# Patient Record
Sex: Female | Born: 1985 | Race: White | Hispanic: No | Marital: Married | Smoking: Never smoker
Health system: Southern US, Community
[De-identification: ages and names within clinical notes are randomized; demographics above are authoritative.]

## PROBLEM LIST (undated history)

## (undated) DIAGNOSIS — F419 Anxiety disorder, unspecified: Secondary | ICD-10-CM

## (undated) DIAGNOSIS — G43909 Migraine, unspecified, not intractable, without status migrainosus: Secondary | ICD-10-CM

---

## 2007-10-28 ENCOUNTER — Emergency Department (HOSPITAL_COMMUNITY): Admission: EM | Admit: 2007-10-28 | Discharge: 2007-10-29 | Payer: Self-pay | Admitting: Emergency Medicine

## 2008-09-05 ENCOUNTER — Emergency Department (HOSPITAL_COMMUNITY): Admission: EM | Admit: 2008-09-05 | Discharge: 2008-09-05 | Payer: Self-pay | Admitting: Emergency Medicine

## 2009-05-09 ENCOUNTER — Emergency Department (HOSPITAL_COMMUNITY): Admission: EM | Admit: 2009-05-09 | Discharge: 2009-05-09 | Payer: Self-pay | Admitting: Emergency Medicine

## 2009-12-18 ENCOUNTER — Emergency Department (HOSPITAL_COMMUNITY): Admission: EM | Admit: 2009-12-18 | Discharge: 2009-12-19 | Payer: Self-pay | Admitting: Emergency Medicine

## 2011-06-17 LAB — COMPREHENSIVE METABOLIC PANEL
AST: 25 U/L (ref 0–37)
Albumin: 3.9 g/dL (ref 3.5–5.2)
Alkaline Phosphatase: 65 U/L (ref 39–117)
BUN: 11 mg/dL (ref 6–23)
Chloride: 107 mEq/L (ref 96–112)
GFR calc Af Amer: >60 mL/min (ref >60)
Potassium: 3.7 mEq/L (ref 3.5–5.1)
Total Bilirubin: 0.6 mg/dL (ref 0.3–1.2)
Total Protein: 7.4 g/dL (ref 6.0–8.3)

## 2011-06-17 LAB — DIFFERENTIAL
Basophils Absolute: 0.1 10*3/uL (ref 0.0–0.1)
Basophils Relative: 1 % (ref 0–1)
Eosinophils Relative: 1 % (ref 0–5)
Lymphocytes Relative: 18 % (ref 12–46)
Monocytes Absolute: 0.5 10*3/uL (ref 0.1–1.0)
Monocytes Relative: 6 % (ref 3–12)
Neutro Abs: 6.9 10*3/uL (ref 1.7–7.7)

## 2011-06-17 LAB — CBC
HCT: 45.4 % (ref 36.0–46.0)
Platelets: 332 10*3/uL (ref 150–400)
RDW: 12.4 % (ref 11.5–15.5)
WBC: 9.2 10*3/uL (ref 4.0–10.5)

## 2011-06-17 LAB — URINALYSIS, ROUTINE W REFLEX MICROSCOPIC
Glucose, UA: NEGATIVE mg/dL
Ketones, ur: NEGATIVE mg/dL
Nitrite: NEGATIVE
Protein, ur: NEGATIVE mg/dL
Urobilinogen, UA: 0.2 mg/dL (ref 0.0–1.0)

## 2011-06-17 LAB — PREGNANCY, URINE: Preg Test, Ur: NEGATIVE

## 2014-09-12 NOTE — L&D Delivery Note (Signed)
Patient was C/C/+2 and pushed for 5 minutes with epidural.   NSVD  female infant, Apgars 8/9, weight pending.   The patient had 2nd laceration repaired with 2-0 vicryl rapide. Fundus was firm. EBL was expected amount. Placenta was delivered intact. Vagina was clear.  Baby was vigorous and doing skin to skin with mother.  Melissa Potts, Jacobs Golab

## 2014-12-19 LAB — OB RESULTS CONSOLE RPR: RPR: NONREACTIVE

## 2014-12-19 LAB — OB RESULTS CONSOLE ANTIBODY SCREEN: ANTIBODY SCREEN: NEGATIVE

## 2014-12-19 LAB — OB RESULTS CONSOLE GC/CHLAMYDIA
CHLAMYDIA, DNA PROBE: NEGATIVE
Gonorrhea: NEGATIVE

## 2014-12-19 LAB — OB RESULTS CONSOLE ABO/RH: RH TYPE: POSITIVE

## 2014-12-19 LAB — OB RESULTS CONSOLE HIV ANTIBODY (ROUTINE TESTING): HIV: NONREACTIVE

## 2014-12-19 LAB — OB RESULTS CONSOLE RUBELLA ANTIBODY, IGM: RUBELLA: IMMUNE

## 2014-12-19 LAB — OB RESULTS CONSOLE HEPATITIS B SURFACE ANTIGEN: HEP B S AG: NEGATIVE

## 2015-06-18 LAB — OB RESULTS CONSOLE GBS: GBS: NEGATIVE

## 2015-07-15 ENCOUNTER — Inpatient Hospital Stay (HOSPITAL_COMMUNITY)
Admission: AD | Admit: 2015-07-15 | Discharge: 2015-07-15 | Disposition: A | Discharge status: Home / Self Care | Payer: BLUE CROSS/BLUE SHIELD | Source: Ambulatory Visit | Attending: Obstetrics and Gynecology | Admitting: Obstetrics and Gynecology

## 2015-07-15 ENCOUNTER — Encounter (HOSPITAL_COMMUNITY): Payer: Self-pay | Admitting: *Deleted

## 2015-07-15 ENCOUNTER — Inpatient Hospital Stay (HOSPITAL_COMMUNITY)
Admission: AD | Admit: 2015-07-15 | Discharge: 2015-07-17 | DRG: 775 | Disposition: A | Discharge status: Home / Self Care | Payer: BLUE CROSS/BLUE SHIELD | Source: Ambulatory Visit | Attending: Obstetrics and Gynecology | Admitting: Obstetrics and Gynecology

## 2015-07-15 ENCOUNTER — Inpatient Hospital Stay (HOSPITAL_COMMUNITY): Payer: BLUE CROSS/BLUE SHIELD | Admitting: Anesthesiology

## 2015-07-15 DIAGNOSIS — Z3A39 39 weeks gestation of pregnancy: Secondary | ICD-10-CM

## 2015-07-15 DIAGNOSIS — IMO0001 Reserved for inherently not codable concepts without codable children: Secondary | ICD-10-CM

## 2015-07-15 DIAGNOSIS — Z6830 Body mass index (BMI) 30.0-30.9, adult: Secondary | ICD-10-CM

## 2015-07-15 DIAGNOSIS — O99214 Obesity complicating childbirth: Secondary | ICD-10-CM | POA: Diagnosis present

## 2015-07-15 DIAGNOSIS — E669 Obesity, unspecified: Secondary | ICD-10-CM | POA: Diagnosis present

## 2015-07-15 HISTORY — DX: Anxiety disorder, unspecified: F41.9

## 2015-07-15 LAB — TYPE AND SCREEN
ABO/RH(D): A POS
ANTIBODY SCREEN: NEGATIVE

## 2015-07-15 LAB — CBC
HCT: 35.3 % — ABNORMAL LOW (ref 36.0–46.0)
Hemoglobin: 11.5 g/dL — ABNORMAL LOW (ref 12.0–15.0)
MCH: 26.1 pg (ref 26.0–34.0)
MCHC: 32.6 g/dL (ref 30.0–36.0)
MCV: 80.2 fL (ref 78.0–100.0)
PLATELETS: 239 10*3/uL (ref 150–400)
RBC: 4.4 MIL/uL (ref 3.87–5.11)
RDW: 13.9 % (ref 11.5–15.5)
WBC: 18.7 10*3/uL — AB (ref 4.0–10.5)

## 2015-07-15 LAB — ABO/RH: ABO/RH(D): A POS

## 2015-07-15 LAB — POCT FERN TEST: POCT FERN TEST: NEGATIVE

## 2015-07-15 MED ORDER — PHENYLEPHRINE 40 MCG/ML (10ML) SYRINGE FOR IV PUSH (FOR BLOOD PRESSURE SUPPORT)
PREFILLED_SYRINGE | INTRAVENOUS | Status: AC
  Filled 2015-07-15: qty 10

## 2015-07-15 MED ORDER — LACTATED RINGERS IV SOLN: 500.0000 mL | INTRAVENOUS | Status: DC | PRN

## 2015-07-15 MED ORDER — ACETAMINOPHEN 325 MG PO TABS: 650.0000 mg | ORAL_TABLET | ORAL | Status: DC | PRN

## 2015-07-15 MED ORDER — OXYTOCIN BOLUS FROM INFUSION
500.0000 mL | INTRAVENOUS | Status: DC
  Administered 2015-07-16: 500 mL via INTRAVENOUS

## 2015-07-15 MED ORDER — OXYTOCIN 40 UNITS IN LACTATED RINGERS INFUSION - SIMPLE MED
62.5000 mL/h | INTRAVENOUS | Status: DC
Start: 2015-07-15 — End: 2015-07-16
  Filled 2015-07-15: qty 1000

## 2015-07-15 MED ORDER — OXYCODONE-ACETAMINOPHEN 5-325 MG PO TABS: 1.0000 | ORAL_TABLET | ORAL | Status: DC | PRN

## 2015-07-15 MED ORDER — ONDANSETRON HCL 4 MG/2ML IJ SOLN: 4.0000 mg | Freq: Four times a day (QID) | INTRAMUSCULAR | Status: DC | PRN

## 2015-07-15 MED ORDER — LIDOCAINE HCL (PF) 1 % IJ SOLN
30.0000 mL | INTRAMUSCULAR | Status: DC | PRN
  Administered 2015-07-16: 30 mL via SUBCUTANEOUS
  Filled 2015-07-15: qty 30

## 2015-07-15 MED ORDER — EPHEDRINE 5 MG/ML INJ: 10.0000 mg | INTRAVENOUS | Status: DC | PRN

## 2015-07-15 MED ORDER — LACTATED RINGERS IV SOLN
INTRAVENOUS | Status: DC
  Administered 2015-07-15: 19:00:00 via INTRAVENOUS

## 2015-07-15 MED ORDER — FENTANYL 2.5 MCG/ML BUPIVACAINE 1/10 % EPIDURAL INFUSION (WH - ANES)
INTRAMUSCULAR | Status: AC
  Filled 2015-07-15: qty 125

## 2015-07-15 MED ORDER — DIPHENHYDRAMINE HCL 50 MG/ML IJ SOLN: 12.5000 mg | INTRAMUSCULAR | Status: DC | PRN

## 2015-07-15 MED ORDER — FLEET ENEMA 7-19 GM/118ML RE ENEM: 1.0000 | ENEMA | RECTAL | Status: DC | PRN

## 2015-07-15 MED ORDER — FENTANYL 2.5 MCG/ML BUPIVACAINE 1/10 % EPIDURAL INFUSION (WH - ANES)
14.0000 mL/h | INTRAMUSCULAR | Status: DC | PRN
  Administered 2015-07-15: 14 mL/h via EPIDURAL

## 2015-07-15 MED ORDER — LIDOCAINE HCL (PF) 1 % IJ SOLN
INTRAMUSCULAR | Status: DC | PRN
  Administered 2015-07-15: 5 mL via EPIDURAL
  Administered 2015-07-15: 2 mL via EPIDURAL
  Administered 2015-07-15: 3 mL via EPIDURAL

## 2015-07-15 MED ORDER — CITRIC ACID-SODIUM CITRATE 334-500 MG/5ML PO SOLN: 30.0000 mL | ORAL | Status: DC | PRN

## 2015-07-15 MED ORDER — OXYCODONE-ACETAMINOPHEN 5-325 MG PO TABS: 2.0000 | ORAL_TABLET | ORAL | Status: DC | PRN

## 2015-07-15 MED ORDER — PHENYLEPHRINE 40 MCG/ML (10ML) SYRINGE FOR IV PUSH (FOR BLOOD PRESSURE SUPPORT): 80.0000 ug | PREFILLED_SYRINGE | INTRAVENOUS | Status: DC | PRN

## 2015-07-15 NOTE — MAU Provider Note (Signed)
Ms. Ellwood HandlerLeslie C Gaeta is a 29 y.o. G4P0011 at 6110w1d  who presents to MAU today complaining of LOF since earlier this morning. The patient states that she has had contractions since last night, but none now. She noted a small amount of blood tinge in the discharge earlier. She denies complications with the pregnancy. She reports good fetal movement.    BP 115/74 mmHg  Pulse 95  Temp(Src) 97.9 F (36.6 C) (Oral)  Resp 20  Ht 5\' 9"  (1.753 m)  Wt 220 lb 6 oz (99.961 kg)  BMI 32.53 kg/m2 GENERAL: Well-developed, well-nourished female in no acute distress.  HEAD: Normocephalic, atraumatic.  CHEST: Normal effort of breathing, regular heart rate ABDOMEN: Soft, nontender, nondistended.  PELVIC: Normal external female genitalia. Vagina is pink and rugated.  Small amount of blood tinged mucus discharge.  Negative pooling. Gravid uterus.   EXTREMITIES: No cyanosis, clubbing, or edema  Fern - negative  Fetal Monitoring:  Baseline: 125 bpm, moderate variability, + accelerations, no decelerations Contractions: few, irregular with moderate UI  A: Membranes intact  P: Report given to RN to contact MD on call for further instructions  Marny LowensteinJulie N Wenzel, PA-C 07/15/2015 6:35 AM

## 2015-07-15 NOTE — Discharge Instructions (Signed)
Braxton Hicks Contractions °Contractions of the uterus can occur throughout pregnancy. Contractions are not always a sign that you are in labor.  °WHAT ARE BRAXTON HICKS CONTRACTIONS?  °Contractions that occur before labor are called Braxton Hicks contractions, or false labor. Toward the end of pregnancy (32-34 weeks), these contractions can develop more often and may become more forceful. This is not true labor because these contractions do not result in opening (dilatation) and thinning of the cervix. They are sometimes difficult to tell apart from true labor because these contractions can be forceful and people have different pain tolerances. You should not feel embarrassed if you go to the hospital with false labor. Sometimes, the only way to tell if you are in true labor is for your health care provider to look for changes in the cervix. °If there are no prenatal problems or other health problems associated with the pregnancy, it is completely safe to be sent home with false labor and await the onset of true labor. °HOW CAN YOU TELL THE DIFFERENCE BETWEEN TRUE AND FALSE LABOR? °False Labor °· The contractions of false labor are usually shorter and not as hard as those of true labor.   °· The contractions are usually irregular.   °· The contractions are often felt in the front of the lower abdomen and in the groin.   °· The contractions may go away when you walk around or change positions while lying down.   °· The contractions get weaker and are shorter lasting as time goes on.   °· The contractions do not usually become progressively stronger, regular, and closer together as with true labor.   °True Labor °· Contractions in true labor last 30-70 seconds, become very regular, usually become more intense, and increase in frequency.   °· The contractions do not go away with walking.   °· The discomfort is usually felt in the top of the uterus and spreads to the lower abdomen and low back.   °· True labor can be  determined by your health care provider with an exam. This will show that the cervix is dilating and getting thinner.   °WHAT TO REMEMBER °· Keep up with your usual exercises and follow other instructions given by your health care provider.   °· Take medicines as directed by your health care provider.   °· Keep your regular prenatal appointments.   °· Eat and drink lightly if you think you are going into labor.   °· If Braxton Hicks contractions are making you uncomfortable:   °¨ Change your position from lying down or resting to walking, or from walking to resting.   °¨ Sit and rest in a tub of warm water.   °¨ Drink 2-3 glasses of water. Dehydration may cause these contractions.   °¨ Do slow and deep breathing several times an hour.   °WHEN SHOULD I SEEK IMMEDIATE MEDICAL CARE? °Seek immediate medical care if: °· Your contractions become stronger, more regular, and closer together.   °· You have fluid leaking or gushing from your vagina.   °· You have a fever.   °· You pass blood-tinged mucus.   °· You have vaginal bleeding.   °· You have continuous abdominal pain.   °· You have low back pain that you never had before.   °· You feel your baby's head pushing down and causing pelvic pressure.   °· Your baby is not moving as much as it used to.   °  °This information is not intended to replace advice given to you by your health care provider. Make sure you discuss any questions you have with your health care   provider. °  °Document Released: 08/29/2005 Document Revised: 09/03/2013 Document Reviewed: 06/10/2013 °Elsevier Interactive Patient Education ©2016 [employer] ° ° ° °Third Trimester of Pregnancy °The third trimester is from week 29 through week 42, months 7 through 9. The third trimester is a time when the fetus is growing rapidly. At the end of the ninth month, the fetus is about 20 inches in length and weighs 6-10 pounds.  °BODY CHANGES °Your body goes through many changes during pregnancy. The changes vary  from woman to woman.  °· Your weight will continue to increase. You can expect to gain 25-35 pounds (11-16 kg) by the end of the pregnancy. °· You may begin to get stretch marks on your hips, abdomen, and breasts. °· You may urinate more often because the fetus is moving lower into your pelvis and pressing on your bladder. °· You may develop or continue to have heartburn as a result of your pregnancy. °· You may develop constipation because certain hormones are causing the muscles that push waste through your intestines to slow down. °· You may develop hemorrhoids or swollen, bulging veins (varicose veins). °· You may have pelvic pain because of the weight gain and pregnancy hormones relaxing your joints between the bones in your pelvis. Backaches may result from overexertion of the muscles supporting your posture. °· You may have changes in your hair. These can include thickening of your hair, rapid growth, and changes in texture. Some women also have hair loss during or after pregnancy, or hair that feels dry or thin. Your hair will most likely return to normal after your baby is born. °· Your breasts will continue to grow and be tender. A yellow discharge may leak from your breasts called colostrum. °· Your belly button may stick out. °· You may feel short of breath because of your expanding uterus. °· You may notice the fetus "dropping," or moving lower in your abdomen. °· You may have a bloody mucus discharge. This usually occurs a few days to a week before labor begins. °· Your cervix becomes thin and soft (effaced) near your due date. °WHAT TO EXPECT AT YOUR PRENATAL EXAMS  °You will have prenatal exams every 2 weeks until week 36. Then, you will have weekly prenatal exams. During a routine prenatal visit: °· You will be weighed to make sure you and the fetus are growing normally. °· Your blood pressure is taken. °· Your abdomen will be measured to track your baby's growth. °· The fetal heartbeat will be  listened to. °· Any test results from the previous visit will be discussed. °· You may have a cervical check near your due date to see if you have effaced. °At around 36 weeks, your caregiver will check your cervix. At the same time, your caregiver will also perform a test on the secretions of the vaginal tissue. This test is to determine if a type of bacteria, Group B streptococcus, is present. Your caregiver will explain this further. °Your caregiver may ask you: °· What your birth plan is. °· How you are feeling. °· If you are feeling the baby move. °· If you have had any abnormal symptoms, such as leaking fluid, bleeding, severe headaches, or abdominal cramping. °· If you are using any tobacco products, including cigarettes, chewing tobacco, and electronic cigarettes. °· If you have any questions. °Other tests or screenings that may be performed during your third trimester include: °· Blood tests that check for low iron levels (anemia). °· Fetal   testing to check the health, activity level, and growth of the fetus. Testing is done if you have certain medical conditions or if there are problems during the pregnancy. °· HIV (human immunodeficiency virus) testing. If you are at high risk, you may be screened for HIV during your third trimester of pregnancy. °FALSE LABOR °You may feel small, irregular contractions that eventually go away. These are called Braxton Hicks contractions, or false labor. Contractions may last for hours, days, or even weeks before true labor sets in. If contractions come at regular intervals, intensify, or become painful, it is best to be seen by your caregiver.  °SIGNS OF LABOR  °· Menstrual-like cramps. °· Contractions that are 5 minutes apart or less. °· Contractions that start on the top of the uterus and spread down to the lower abdomen and back. °· A sense of increased pelvic pressure or back pain. °· A watery or bloody mucus discharge that comes from the vagina. °If you have any of  these signs before the 37th week of pregnancy, call your caregiver right away. You need to go to the hospital to get checked immediately. °HOME CARE INSTRUCTIONS  °· Avoid all smoking, herbs, alcohol, and unprescribed drugs. These chemicals affect the formation and growth of the baby. °· Do not use any tobacco products, including cigarettes, chewing tobacco, and electronic cigarettes. If you need help quitting, ask your health care provider. You may receive counseling support and other resources to help you quit. °· Follow your caregiver's instructions regarding medicine use. There are medicines that are either safe or unsafe to take during pregnancy. °· Exercise only as directed by your caregiver. Experiencing uterine cramps is a good sign to stop exercising. °· Continue to eat regular, healthy meals. °· Wear a good support bra for breast tenderness. °· Do not use hot tubs, steam rooms, or saunas. °· Wear your seat belt at all times when driving. °· Avoid raw meat, uncooked cheese, cat litter boxes, and soil used by cats. These carry germs that can cause birth defects in the baby. °· Take your prenatal vitamins. °· Take 1500-2000 mg of calcium daily starting at the 20th week of pregnancy until you deliver your baby. °· Try taking a stool softener (if your caregiver approves) if you develop constipation. Eat more high-fiber foods, such as fresh vegetables or fruit and whole grains. Drink plenty of fluids to keep your urine clear or pale yellow. °· Take warm sitz baths to soothe any pain or discomfort caused by hemorrhoids. Use hemorrhoid cream if your caregiver approves. °· If you develop varicose veins, wear support hose. Elevate your feet for 15 minutes, 3-4 times a day. Limit salt in your diet. °· Avoid heavy lifting, wear low heal shoes, and practice good posture. °· Rest a lot with your legs elevated if you have leg cramps or low back pain. °· Visit your dentist if you have not gone during your pregnancy. Use a  soft toothbrush to brush your teeth and be gentle when you floss. °· A sexual relationship may be continued unless your caregiver directs you otherwise. °· Do not travel far distances unless it is absolutely necessary and only with the approval of your caregiver. °· Take prenatal classes to understand, practice, and ask questions about the labor and delivery. °· Make a trial run to the hospital. °· Pack your hospital bag. °· Prepare the baby's nursery. °· Continue to go to all your prenatal visits as directed by your caregiver. °SEEK MEDICAL CARE   IF: °· You are unsure if you are in labor or if your water has broken. °· You have dizziness. °· You have mild pelvic cramps, pelvic pressure, or nagging pain in your abdominal area. °· You have persistent nausea, vomiting, or diarrhea. °· You have a bad smelling vaginal discharge. °· You have pain with urination. °SEEK IMMEDIATE MEDICAL CARE IF:  °· You have a fever. °· You are leaking fluid from your vagina. °· You have spotting or bleeding from your vagina. °· You have severe abdominal cramping or pain. °· You have rapid weight loss or gain. °· You have shortness of breath with chest pain. °· You notice sudden or extreme swelling of your face, hands, ankles, feet, or legs. °· You have not felt your baby move in over an hour. °· You have severe headaches that do not go away with medicine. °· You have vision changes. °  °This information is not intended to replace advice given to you by your health care provider. Make sure you discuss any questions you have with your health care provider. °  °Document Released: 08/23/2001 Document Revised: 09/19/2014 Document Reviewed: 10/30/2012 °Elsevier Interactive Patient Education ©2016 Elsevier Inc. ° °

## 2015-07-15 NOTE — Anesthesia Preprocedure Evaluation (Addendum)
Anesthesia Evaluation  Patient identified by MRN, date of birth, ID band Patient awake    Reviewed: Allergy & Precautions, NPO status , Patient's Chart, lab work & pertinent test results  History of Anesthesia Complications Negative for: history of anesthetic complications  Airway Mallampati: III  TM Distance: >3 FB Neck ROM: Full    Dental  (+) Teeth Intact, Dental Advisory Given   Pulmonary neg pulmonary ROS,    Pulmonary exam normal breath sounds clear to auscultation       Cardiovascular Exercise Tolerance: Good negative cardio ROS Normal cardiovascular exam Rhythm:Regular Rate:Normal     Neuro/Psych PSYCHIATRIC DISORDERS Anxiety negative neurological ROS     GI/Hepatic negative GI ROS, Neg liver ROS,   Endo/Other  Obesity   Renal/GU negative Renal ROS     Musculoskeletal negative musculoskeletal ROS (+)   Abdominal   Peds  Hematology  (+) Blood dyscrasia, anemia ,   Anesthesia Other Findings Day of surgery medications reviewed with the patient.  Reproductive/Obstetrics (+) Pregnancy                            Anesthesia Physical Anesthesia Plan  ASA: II  Anesthesia Plan: Epidural   Post-op Pain Management:    Induction:   Airway Management Planned:   Additional Equipment:   Intra-op Plan:   Post-operative Plan:   Informed Consent: I have reviewed the patients History and Physical, chart, labs and discussed the procedure including the risks, benefits and alternatives for the proposed anesthesia with the patient or authorized representative who has indicated his/her understanding and acceptance.   Dental advisory given  Plan Discussed with:   Anesthesia Plan Comments: (Patient identified. Risks/Benefits/Options discussed with patient including but not limited to bleeding, infection, nerve damage, paralysis, failed block, incomplete pain control, headache, blood  pressure changes, nausea, vomiting, reactions to medication both or allergic, itching and postpartum back pain. Confirmed with bedside nurse the patient's most recent platelet count. Confirmed with patient that they are not currently taking any anticoagulation, have any bleeding history or any family history of bleeding disorders. Patient expressed understanding and wished to proceed. All questions were answered. )        Anesthesia Quick Evaluation

## 2015-07-15 NOTE — H&P (Signed)
29 y.o. 5296w1d  G4P0011 comes in c/o labor.  Otherwise has good fetal movement and no bleeding.  Past Medical History  Diagnosis Date  . Anxiety    History reviewed. No pertinent past surgical history.  OB History  Gravida Para Term Preterm AB SAB TAB Ectopic Multiple Living  4 1 0  1 1    1     # Outcome Date GA Lbr Len/2nd Weight Sex Delivery Anes PTL Lv  4 Current           3 Para           2 Gravida      Vag-Spont     1 SAB               Social History   Social History  . Marital Status: Married    Spouse Name: N/A  . Number of Children: N/A  . Years of Education: N/A   Occupational History  . Not on file.   Social History Main Topics  . Smoking status: Never Smoker   . Smokeless tobacco: Never Used  . Alcohol Use: No  . Drug Use: No  . Sexual Activity: Yes   Other Topics Concern  . Not on file   Social History Narrative   Review of patient's allergies indicates no known allergies.    Prenatal Transfer Tool  Maternal Diabetes: No Genetic Screening: Declined Maternal Ultrasounds/Referrals: Normal Fetal Ultrasounds or other Referrals:  None Maternal Substance Abuse:  No Significant Maternal Medications:  None Significant Maternal Lab Results: Lab values include: Group B Strep negative  Other PNC: uncomplicated.    Filed Vitals:   07/15/15 1903  BP: 139/107  Pulse: 94  Temp:   Resp:      Lungs/Cor:  NAD Abdomen:  soft, gravid Ex:  no cords, erythema SVE:  4.5/90/-1 FHTs:  135, good STV, NST R Toco:  q2-3   A/P   Admit with labor  GBS neg  Epidural when desired  Once comfortable will consider AROM  Other routine care  [place], [facility] Center[facility]NEY

## 2015-07-15 NOTE — MAU Note (Signed)
PT  SAYS SHE WOKE  AT 0400-  WETNESS   - TO B-ROOM   BUT  FLUID  KEEPS  COMING   .  UC  ALL NIGHT.   VE IN OFFICE   ON Monday  2 CM.        DENIES HSV AND   MRSA.  GBS- NEG

## 2015-07-15 NOTE — MAU Note (Signed)
Pt presents to MAU with complaints of contractions. Pt was evaluated in MAU earlier today and discharged home. Denies any LOF

## 2015-07-15 NOTE — Anesthesia Procedure Notes (Signed)
Epidural Patient location during procedure: OB  Staffing Anesthesiologist: Cecile HearingURK, Nuria Phebus EDWARD Performed by: anesthesiologist   Preanesthetic Checklist Completed: patient identified, pre-op evaluation, timeout performed, IV checked, risks and benefits discussed and monitors and equipment checked  Epidural Patient position: sitting Prep: DuraPrep Patient monitoring: blood pressure and continuous pulse ox Approach: midline Location: L2-L3 Injection technique: LOR air  Needle:  Needle type: Tuohy  Needle gauge: 17 G Needle length: 9 cm Needle insertion depth: 5 cm Catheter size: 19 Gauge Catheter at skin depth: 10 cm Test dose: negative and Other (1% Lidocaine)  Additional Notes Patient identified.  Risk benefits discussed including failed block, incomplete pain control, headache, nerve damage, paralysis, blood pressure changes, nausea, vomiting, reactions to medication both toxic or allergic, and postpartum back pain.  Patient expressed understanding and wished to proceed.  All questions were answered.  Sterile technique used throughout procedure and epidural site dressed with sterile barrier dressing. No paresthesia or other complications noted. The patient did not experience any signs of intravascular injection such as tinnitus or metallic taste in mouth nor signs of intrathecal spread such as rapid motor block. Please see nursing notes for vital signs. Reason for block:procedure for pain

## 2015-07-15 NOTE — MAU Note (Signed)
Notified provider of patient G3P1. cxt every 2-3 mins was 3/80 and hour ago now she is 4.5/90/-1. Provider said to admit her and to put in routine admit orders and that patient could have an epidural.

## 2015-07-16 ENCOUNTER — Encounter (HOSPITAL_COMMUNITY): Payer: Self-pay

## 2015-07-16 ENCOUNTER — Encounter (HOSPITAL_COMMUNITY): Payer: Self-pay | Admitting: Anesthesiology

## 2015-07-16 LAB — CBC
HCT: 33.5 % — ABNORMAL LOW (ref 36.0–46.0)
HEMOGLOBIN: 10.8 g/dL — AB (ref 12.0–15.0)
MCH: 26 pg (ref 26.0–34.0)
MCHC: 32.2 g/dL (ref 30.0–36.0)
MCV: 80.7 fL (ref 78.0–100.0)
PLATELETS: 221 10*3/uL (ref 150–400)
RBC: 4.15 MIL/uL (ref 3.87–5.11)
RDW: 14.1 % (ref 11.5–15.5)
WBC: 23.4 10*3/uL — ABNORMAL HIGH (ref 4.0–10.5)

## 2015-07-16 LAB — RPR: RPR Ser Ql: NONREACTIVE

## 2015-07-16 MED ORDER — SENNOSIDES-DOCUSATE SODIUM 8.6-50 MG PO TABS
2.0000 | ORAL_TABLET | ORAL | Status: DC
  Filled 2015-07-16: qty 2

## 2015-07-16 MED ORDER — ACETAMINOPHEN 325 MG PO TABS: 650.0000 mg | ORAL_TABLET | ORAL | Status: DC | PRN

## 2015-07-16 MED ORDER — IBUPROFEN 600 MG PO TABS
600.0000 mg | ORAL_TABLET | Freq: Four times a day (QID) | ORAL | Status: DC
  Administered 2015-07-16 – 2015-07-17 (×7): 600 mg via ORAL
  Filled 2015-07-16 (×7): qty 1

## 2015-07-16 MED ORDER — WITCH HAZEL-GLYCERIN EX PADS
1.0000 "application " | MEDICATED_PAD | CUTANEOUS | Status: DC | PRN
  Administered 2015-07-16: 1 via TOPICAL

## 2015-07-16 MED ORDER — LANOLIN HYDROUS EX OINT: TOPICAL_OINTMENT | CUTANEOUS | Status: DC | PRN

## 2015-07-16 MED ORDER — TETANUS-DIPHTH-ACELL PERTUSSIS 5-2.5-18.5 LF-MCG/0.5 IM SUSP: 0.5000 mL | Freq: Once | INTRAMUSCULAR | Status: DC

## 2015-07-16 MED ORDER — ZOLPIDEM TARTRATE 5 MG PO TABS: 5.0000 mg | ORAL_TABLET | Freq: Every evening | ORAL | Status: DC | PRN

## 2015-07-16 MED ORDER — OXYCODONE-ACETAMINOPHEN 5-325 MG PO TABS: 2.0000 | ORAL_TABLET | ORAL | Status: DC | PRN

## 2015-07-16 MED ORDER — OXYCODONE-ACETAMINOPHEN 5-325 MG PO TABS: 1.0000 | ORAL_TABLET | ORAL | Status: DC | PRN

## 2015-07-16 MED ORDER — BENZOCAINE-MENTHOL 20-0.5 % EX AERO
1.0000 "application " | INHALATION_SPRAY | CUTANEOUS | Status: DC | PRN
  Administered 2015-07-16: 1 via TOPICAL
  Filled 2015-07-16: qty 56

## 2015-07-16 MED ORDER — DIBUCAINE 1 % RE OINT: 1.0000 "application " | TOPICAL_OINTMENT | RECTAL | Status: DC | PRN

## 2015-07-16 MED ORDER — SIMETHICONE 80 MG PO CHEW: 80.0000 mg | CHEWABLE_TABLET | ORAL | Status: DC | PRN

## 2015-07-16 MED ORDER — ONDANSETRON HCL 4 MG PO TABS: 4.0000 mg | ORAL_TABLET | ORAL | Status: DC | PRN

## 2015-07-16 MED ORDER — DIPHENHYDRAMINE HCL 25 MG PO CAPS: 25.0000 mg | ORAL_CAPSULE | Freq: Four times a day (QID) | ORAL | Status: DC | PRN

## 2015-07-16 MED ORDER — PRENATAL MULTIVITAMIN CH
1.0000 | ORAL_TABLET | Freq: Every day | ORAL | Status: DC
  Administered 2015-07-16 – 2015-07-17 (×2): 1 via ORAL
  Filled 2015-07-16 (×2): qty 1

## 2015-07-16 MED ORDER — ONDANSETRON HCL 4 MG/2ML IJ SOLN: 4.0000 mg | INTRAMUSCULAR | Status: DC | PRN

## 2015-07-16 NOTE — Anesthesia Postprocedure Evaluation (Signed)
  Anesthesia Post-op Note  Patient: Melissa Potts  Procedure(s) Performed: * No procedures listed *  Patient Location: PACU and Mother/Baby  Anesthesia Type:Epidural  Level of Consciousness: awake, alert  and oriented  Airway and Oxygen Therapy: Patient Spontanous Breathing  Post-op Pain: mild  Post-op Assessment: Patient's Cardiovascular Status Stable, Respiratory Function Stable, No signs of Nausea or vomiting, Adequate PO intake, Pain level controlled, No headache, No backache and Patient able to bend at knees              Post-op Vital Signs: Reviewed and stable  Last Vitals:  Filed Vitals:   07/16/15 0655  BP: 124/72  Pulse: 72  Temp: 36.8 C  Resp: 18    Complications: No apparent anesthesia complications

## 2015-07-16 NOTE — Progress Notes (Signed)
Post Partum Day 1 Subjective: no complaints, up ad lib, voiding and tolerating PO  Objective: Blood pressure 124/72, pulse 72, temperature 98.2 F (36.8 C), temperature source Oral, resp. rate 18, height 5\' 11"  (1.803 m), weight 218 lb (98.884 kg), SpO2 100 %, unknown if currently breastfeeding.  Physical Exam:  General: alert, cooperative and appears stated age Lochia: appropriate Uterine Fundus: firm   Recent Labs  07/15/15 1820 07/16/15 0620  HGB 11.5* 10.8*  HCT 35.3* 33.5*    Assessment/Plan: Plan for discharge tomorrow and Breastfeeding  Declines circ   LOS: 1 day   Tadd Holtmeyer H. 07/16/2015, 9:49 AM

## 2015-07-16 NOTE — Lactation Note (Signed)
This note was copied from the chart of Melissa Potts Anguiano. Lactation Consultation Note  Patient Name: Melissa Potts Cariker ZOXWR'UToday's Date: 07/16/2015 Reason for consult: Follow-up assessment;Breast/nipple pain  16 hours old , baby has been consistent at the breast. Per mom just finished feeding both breast .  Baby  still acting hungry so LC changed large  diaper and assisted with football position.  Reviewed basics - breast massage , hand express,  Breast compressions until swallows and intermittent. Mom has comfort gels.    Maternal Data Has patient been taught Hand Expression?: Yes  Feeding Feeding Type: Breast Fed Length of feed: 12 min (per mom )  LATCH Score/Interventions Latch: Grasps breast easily, tongue down, lips flanged, rhythmical sucking.  Audible Swallowing: Spontaneous and intermittent Intervention(s): Skin to skin;Hand expression  Type of Nipple: Everted at rest and after stimulation  Comfort (Breast/Nipple): Soft / non-tender     Hold (Positioning): Assistance needed to correctly position infant at breast and maintain latch. Intervention(s): Breastfeeding basics reviewed;Support Pillows;Position options;Skin to skin  LATCH Score: 9  Lactation Tools Discussed/Used Tools: Comfort gels   Consult Status Consult Status: Follow-up Date: 07/17/15 Follow-up type: In-patient    Kathrin Greathouseorio, Karon Cotterill Ann 07/16/2015, 4:49 PM

## 2015-07-16 NOTE — Lactation Note (Signed)
This note was copied from the chart of Melissa Irean HongLeslie Lech. Lactation Consultation Note Experienced BF mom has a 27 yr old that she BF for 8 months. States she is going to try to BF this baby for 1 yr.  Mom states this baby is BF great and latching w/o difficulty.  Educated about newborn behavior. Reviewed Baby & Me book's Breastfeeding Basics. Mom encouraged to do skin-to-skin.[facility]/LC brochure given w/resources, support groups and LC services. Patient Name: Melissa Potts [id number]'XToday's Date: 07/16/2015 Reason for consult: Initial assessment   Maternal Data Formula Feeding for Exclusion: No Has patient been taught Hand Expression?: Yes Does the patient have breastfeeding experience prior to this delivery?: Yes  Feeding Feeding Type: Breast Fed  LATCH Score/Interventions       Type of Nipple: Everted at rest and after stimulation  Comfort (Breast/Nipple): Soft / non-tender     Hold (Positioning): No assistance needed to correctly position infant at breast.     Lactation Tools Discussed/Used [facility] No   Consult Status Consult Status: PRN Date: 07/17/15 Follow-up type: In-patient    Charyl DancerCARVER, Siobhan Zaro G 07/16/2015, 4:41 AM

## 2015-07-16 NOTE — Addendum Note (Signed)
Addendum  created 07/16/15 1218 by Elbert Ewingsolleen S Andrey Hoobler, CRNA   Modules edited: Charges VN, Notes Section   Notes Section:  File: [id number]

## 2015-07-17 NOTE — Clinical Social Work Maternal (Signed)
CLINICAL SOCIAL WORK MATERNAL/CHILD NOTE  Patient Details  Name: Dannell C Glahn MRN: [id number] Date of Birth: 12/16/1985  Date:  07/17/2015  Clinical Social Worker Initiating Note:  Talbot Monarch MSW, LCSW Date/ Time Initiated:  07/17/15/0910     Child's Name:  Ellington   Legal Guardian:  Brandon and Cruzita Hildebrant  Need for Interpreter:  None   Date of Referral:  07/16/15     Reason for Referral:  History of anxiety  Referral Source:  Central Nursery   Address:  [address] , [place] [address]  Phone number:  [phone]   Household Members:  Minor Children (Sebastian: 2 years old), Spouse   Natural Supports (not living in the home):  Extended Family, Friends, Immediate Family   Professional Supports: None   Employment: Full-time   Type of Work: Teacher   Education:    N/A  Financial Resources:  Private Insurance   Other Resources:    None identified  Cultural/Religious Considerations Which May Impact Care:  None reported  Strengths:  Ability to meet basic needs , Pediatrician chosen , Home prepared for child    Risk Factors/Current Problems:  None   Cognitive State:  Able to Concentrate , Alert , Insightful , Linear Thinking , Goal Oriented    Mood/Affect:  Bright , Happy , Comfortable , Calm    CSW Assessment:  CSW received request for consult due to MOB presenting with a history of anxiety.  MOB's mother was also present in the room, and provided consent for her to remain in the room during the assessment.  MOB presented in a pleasant mood, displayed a full range in affect, and expressed eagerness and readiness for discharge.   MOB was observed to be smiling as she cared for and interacted with the infant, and did not exhibit any symptoms of anxiety during the assessment.   MOB reported feelings of happiness and excitement secondary to the infant's birth and related to her childbirth experience . She stated she feels more comfortable and  confident as this is her second child, and expressed readiness to return home.  She endorsed presence of strong support system, and she expressed comfort utilizing her support to assist her transition home.  MOB shared that she is looking forward to transitioning to caring for two sons, and discussed an awareness of the range of emotions that accompany the adjustment period.  MOB reported belief that she is well supported by her employer, and discussed looking forward to spending time at home with the infant.  MOB denied presence of acute psychosocial stressors that may negatively impact her transition home.   Per MOB, history of anxiety occurred more than 3 years ago. She stated that she was previously participating in therapy and was prescribed medications when she noted that anxiety was negatively impacting her life. She shared that she discontinued treatment prior to conceive her first child.  MOB denied any additional/ongoing need for treatment, and shared belief that therapy assisted her to learn cognitive techniques and emotional regulation skills that have helped her to manage her symptoms.  She denied presence of perinatal mood disorders after her first child was born, and denied mental health concerns during this pregnancy.  MOB presented as attentive and engaged as CSW normalized range of emotions that accompany transition postpartum, including the Baby Blues. CSW reviewed signs and symptoms of perinatal mood disorders, and MOB agreed to follow up with her medical provider if she notes onset of symptoms.   MOB   denied questions, concerns, or needs at this time. She expressed appreciation for the information, and agreed to contact CSW if needs arise during the admission.    CSW Plan/Description:   1)Patient/Family Education: Perinatal mood disorders 2)No Further Intervention Required/No Barriers to Discharge    Kelby FamVenning, Obediah Welles N, LCSW 07/17/2015, 11:00 AM

## 2015-07-17 NOTE — Discharge Summary (Signed)
Obstetric Discharge Summary Reason for Admission: onset of labor Prenatal Procedures: none Intrapartum Procedures: spontaneous vaginal delivery Postpartum Procedures: none Complications-Operative and Postpartum: 2 degree perineal laceration HEMOGLOBIN  Date Value Ref Range Status  07/16/2015 10.8* 12.0 - 15.0 g/dL Final   HCT  Date Value Ref Range Status  07/16/2015 33.5* 36.0 - 46.0 % Final    Discharge Diagnoses: Term Pregnancy-delivered  Discharge Information: Date: 07/17/2015 Activity: pelvic rest Diet: routine Medications: Ibuprofen Condition: stable Instructions: refer to practice specific booklet Discharge to: home Follow-up Information    Follow up with CALLAHAN, SIDNEY, DO In 4 weeks.   Specialty:  Obstetrics and Gynecology   Contact information:   [address] [place] [address] [phone]       Newborn Data: Live born female  Birth Weight: 7 lb 8.3 oz (3410 g) APGAR: 8, 9  Home with mother.  Melissa Potts A 07/17/2015, 7:40 AM

## 2015-07-17 NOTE — Progress Notes (Signed)
Patient is eating, ambulating, voiding.  Pain control is good.  Filed Vitals:   07/16/15 0655 07/16/15 1448 07/16/15 1852 07/17/15 0538  BP: 124/72 109/66 116/70 113/74  Pulse: 72 71 69 75  Temp: 98.2 F (36.8 C) 97.9 F (36.6 C) 97.8 F (36.6 C) 97.6 F (36.4 C)  TempSrc: Oral Oral Oral Oral  Resp: 18 20 18 18   Height:      Weight:      SpO2:        Fundus firm Perineum without swelling.  Lab Results  Component Value Date   WBC 23.4* 07/16/2015   HGB 10.8* 07/16/2015   HCT 33.5* 07/16/2015   MCV 80.7 07/16/2015   PLT 221 07/16/2015    --/--/A POS, A POS (11/02 1820)/RI  A/P Post partum day 1.  Routine care.  Expect d/c routine.    Harrell Niehoff A

## 2015-07-17 NOTE — Lactation Note (Signed)
This note was copied from the chart of Melissa Potts [place]. Lactation Consultation Note  Patient Name: Melissa Potts Mcclellan GUYQI'HToday's Date: 07/17/2015 Reason for consult: Follow-up assessment  33 hours old , for early D/C today . 7-3.2 oz, 4 % weight loss. Voids and stools adequate for age. Baby has been consistently breast feeding and Latch score range - 8-9 's, @ 33 hours Bili 5.4  Per mom nipples are still sore but not any worse than yesterday.  Per mom EBM is helping a lot , and using the comfort gels between feedings.  Baby awake and hungry and LC reviewed basics, 1st latch LC guided mom to latch with depth  And per mom more comfortable. Baby released after 7 mins , nipple normal shape, and mom independently  Re- latched baby, multiply swallows noted.  Sore nipple and engorgement prevention and tx reviewed. Per mom has a DEBP at home.  LC encouraged mom to call if soreness isn't improved at [facility]  LC encouraged naps, rest , plenty fluids, nutritious snacks and meals due to sore nipples , also hx. Of anxiety. Mom has supportive husband and mother. Mother informed of post-discharge support and given phone number to the lactation department, including services for  phone call assistance; out-patient appointments; and breastfeeding support group. List of other breastfeeding resources in  the community given in the handout. Encouraged mother to call for problems or concerns related to breastfeeding.    Maternal Data Has patient been taught Hand Expression?: Yes  Feeding Feeding Type: Breast Fed Length of feed:  (swallows noted , comfortable latch )  LATCH Score/Interventions Latch: Grasps breast easily, tongue down, lips flanged, rhythmical sucking.  Audible Swallowing: Spontaneous and intermittent  Type of Nipple: Everted at rest and after stimulation  Comfort (Breast/Nipple): Filling, red/small blisters or bruises, mild/mod discomfort  Problem noted: Filling  Hold  (Positioning): No assistance needed to correctly position infant at breast. Intervention(s): Breastfeeding basics reviewed;Support Pillows;Position options;Skin to skin  LATCH Score: 9  Lactation Tools Discussed/Used Tools: Comfort gels WIC Program: No Pump Review: Setup, frequency, and cleaning;Milk Storage   Consult Status Consult Status: Complete Date: 07/17/15 Follow-up type: In-patient    Kathrin Greathouseorio, Seirra Kos Ann 07/17/2015, 9:38 AM

## 2016-09-05 ENCOUNTER — Emergency Department (HOSPITAL_COMMUNITY)
Admission: EM | Admit: 2016-09-05 | Discharge: 2016-09-05 | Disposition: A | Discharge status: Home / Self Care | Payer: BLUE CROSS/BLUE SHIELD | Attending: Emergency Medicine | Admitting: Emergency Medicine

## 2016-09-05 ENCOUNTER — Encounter (HOSPITAL_COMMUNITY): Payer: Self-pay | Admitting: Emergency Medicine

## 2016-09-05 ENCOUNTER — Emergency Department (HOSPITAL_COMMUNITY): Payer: BLUE CROSS/BLUE SHIELD

## 2016-09-05 DIAGNOSIS — Y939 Activity, unspecified: Secondary | ICD-10-CM | POA: Insufficient documentation

## 2016-09-05 DIAGNOSIS — S060X0A Concussion without loss of consciousness, initial encounter: Secondary | ICD-10-CM

## 2016-09-05 DIAGNOSIS — W228XXA Striking against or struck by other objects, initial encounter: Secondary | ICD-10-CM | POA: Insufficient documentation

## 2016-09-05 DIAGNOSIS — Y929 Unspecified place or not applicable: Secondary | ICD-10-CM | POA: Insufficient documentation

## 2016-09-05 DIAGNOSIS — Y999 Unspecified external cause status: Secondary | ICD-10-CM | POA: Insufficient documentation

## 2016-09-05 DIAGNOSIS — S0990XA Unspecified injury of head, initial encounter: Secondary | ICD-10-CM | POA: Diagnosis present

## 2016-09-05 MED ORDER — ONDANSETRON HCL 4 MG PO TABS: 4.0000 mg | ORAL_TABLET | Freq: Four times a day (QID) | ORAL | 0 refills | Status: DC

## 2016-09-05 MED ORDER — ACETAMINOPHEN 325 MG PO TABS
650.0000 mg | ORAL_TABLET | Freq: Once | ORAL | Status: AC
  Administered 2016-09-05: 650 mg via ORAL
  Filled 2016-09-05: qty 2

## 2016-09-05 MED ORDER — ONDANSETRON 4 MG PO TBDP
4.0000 mg | ORAL_TABLET | Freq: Once | ORAL | Status: AC
  Administered 2016-09-05: 4 mg via ORAL
  Filled 2016-09-05: qty 1

## 2016-09-05 NOTE — ED Provider Notes (Signed)
[facility]-EMERGENCY DEPT Provider Note   CSN: [id number] Arrival date & time: 09/05/16  1928  By signing my name below, I, Melissa Potts, attest that this documentation has been prepared under the direction and in the presence of Melissa Peliffany Chynah Orihuela, PA-C. Electronically Signed: Doreatha MartinEva Potts, ED Scribe. 09/05/16. 8:22 PM.    History   Chief Complaint Chief Complaint  Patient presents with  . Head Injury    HPI Melissa Potts is a 30 y.o. female who presents to the Emergency Department complaining of moderate right temple pain and pressure with radiation to right ear s/p head injury that occurred 2 hours ago. Pt states she struck her right temple on the corner of a cabinet after standing up quickly. She denies LOC, but states she has since felt nauseated and lightheaded. Pt also complains she finds it difficult to gather her thoughts since striking her head. Husband states the pt has been acting a bit slower since the injury. Pt is ambulatory without difficulty. Pt states she has rested and applied ice to the area with temporary relief. She states her nausea is alleviated at rest and worsened with movement and walking. Pt denies dental injury, neck pain, additional injuries.   The history is provided by the patient and the spouse. No language interpreter was used.    Past Medical History:  Diagnosis Date  . Anxiety     Patient Active Problem List   Diagnosis Date Noted  . Active labor at term 07/15/2015    History reviewed. No pertinent surgical history.  OB History    Gravida Para Term Preterm AB Living   3 1 1   1 1    SAB TAB Ectopic Multiple Live Births   1     0 1       Home Medications    Prior to Admission medications   Medication Sig Start Date End Date Taking? Authorizing Provider  acetaminophen (TYLENOL) 500 MG tablet Take 500 mg by mouth every 6 (six) hours as needed for moderate pain or headache.    Yes Historical Provider, MD  DM-Phenylephrine-Acetaminophen (VICKS  DAYQUIL COLD & FLU PO) Take 2 capsules by mouth every 6 (six) hours as needed (congestion).   Yes Historical Provider, MD  ondansetron (ZOFRAN) 4 MG tablet Take 1 tablet (4 mg total) by mouth every 6 (six) hours. 09/05/16   Melissa Peliffany Particia Strahm, PA-C    Family History History reviewed. No pertinent family history.  Social History Social History  Substance Use Topics  . Smoking status: Never Smoker  . Smokeless tobacco: Never Used  . Alcohol use No     Allergies   Banana   Review of Systems Review of Systems  HENT: Positive for ear pain. Negative for dental problem.   Gastrointestinal: Positive for nausea.  Musculoskeletal: Negative for neck pain.  Neurological: Positive for light-headedness and headaches. Negative for syncope.  All other systems reviewed and are negative.    Physical Exam Updated Vital Signs BP (!) 114/54 (BP Location: Left Arm)   Pulse 65   Temp 97.6 F (36.4 C) (Oral)   Resp 19   Ht 5\' 11"  (1.803 m)   Wt 81.7 kg   SpO2 98%   BMI 25.12 kg/m   Physical Exam  Constitutional: She is oriented to person, place, and time. She appears well-developed and well-nourished.  HENT:  Head: Normocephalic.  Mild tenderness to the right temporal region. Small amount of ecchymosis. No hematoma. No skull depression. FROM of her jaw.  Eyes: Conjunctivae are normal.  Cardiovascular: Normal rate.   Pulmonary/Chest: Effort normal. No respiratory distress.  Abdominal: She exhibits no distension.  Musculoskeletal: Normal range of motion.  Neurological: She is alert and oriented to person, place, and time. She has normal strength. She displays no tremor. No cranial nerve deficit or sensory deficit. Coordination normal.  Skin: Skin is warm and dry.  Psychiatric: She has a normal mood and affect. Her behavior is normal.  Nursing note and vitals reviewed.    ED Treatments / Results   DIAGNOSTIC STUDIES: Oxygen Saturation is 100% on RA, normal by my interpretation.     COORDINATION OF CARE: 8:16 PM Discussed treatment plan with pt at bedside which includes CT head, Zofran, Tylenol and pt agreed to plan.    Labs (all labs ordered are listed, but only abnormal results are displayed) Labs Reviewed - No data to display  EKG  EKG Interpretation None       Radiology No results found.  Procedures Procedures (including critical care time)  Medications Ordered in ED Medications  ondansetron (ZOFRAN-ODT) disintegrating tablet 4 mg (4 mg Oral Given 09/05/16 2033)  acetaminophen (TYLENOL) tablet 650 mg (650 mg Oral Given 09/05/16 2033)     Initial Impression / Assessment and Plan / ED Course  I have reviewed the triage vital signs and the nursing notes.  Pertinent labs & imaging results that were available during my care of the patient were reviewed by me and considered in my medical decision making (see chart for details).  Clinical Course     Pt HA treated and improved while in ED.  Presentation is like pts typical HA and non concerning for [facility], ICH, Meningitis, or temporal arteritis. Pt is afebrile with no focal neuro deficits, nuchal rigidity, or change in vision. Pt is to follow up with PCP to discuss prophylactic medication. Pt verbalizes understanding and is agreeable with plan to dc.    Final Clinical Impressions(s) / ED Diagnoses   Final diagnoses:  Concussion without loss of consciousness, initial encounter    New Prescriptions Discharge Medication List as of 09/05/2016  9:41 PM    START taking these medications   Details  ondansetron (ZOFRAN) 4 MG tablet Take 1 tablet (4 mg total) by mouth every 6 (six) hours., Starting Mon 09/05/2016, Print        I personally performed the services described in this documentation, which was scribed in my presence. The recorded information has been reviewed and is accurate.    Melissa Peliffany Cristel Rail, PA-C 09/09/16 [id number]    Melissa BuccoMelanie Belfi, MD 09/11/16 0700

## 2016-09-05 NOTE — ED Triage Notes (Signed)
Pt states while dressing her child she stood up and struck her right temple area against cabinet corner and has been dizzy and nauseated since that time approx. 2 hrs ago

## 2016-09-05 NOTE — ED Notes (Signed)
Patient transported to CT 

## 2017-02-13 ENCOUNTER — Emergency Department (HOSPITAL_COMMUNITY): Payer: BLUE CROSS/BLUE SHIELD

## 2017-02-13 ENCOUNTER — Encounter (HOSPITAL_COMMUNITY): Payer: Self-pay

## 2017-02-13 ENCOUNTER — Emergency Department (HOSPITAL_COMMUNITY)
Admission: EM | Admit: 2017-02-13 | Discharge: 2017-02-13 | Disposition: A | Discharge status: Home / Self Care | Payer: BLUE CROSS/BLUE SHIELD | Attending: Emergency Medicine | Admitting: Emergency Medicine

## 2017-02-13 DIAGNOSIS — Y92410 Unspecified street and highway as the place of occurrence of the external cause: Secondary | ICD-10-CM | POA: Insufficient documentation

## 2017-02-13 DIAGNOSIS — Y9389 Activity, other specified: Secondary | ICD-10-CM | POA: Insufficient documentation

## 2017-02-13 DIAGNOSIS — S0990XA Unspecified injury of head, initial encounter: Secondary | ICD-10-CM | POA: Insufficient documentation

## 2017-02-13 DIAGNOSIS — S40012A Contusion of left shoulder, initial encounter: Secondary | ICD-10-CM | POA: Diagnosis not present

## 2017-02-13 DIAGNOSIS — Y999 Unspecified external cause status: Secondary | ICD-10-CM | POA: Diagnosis not present

## 2017-02-13 DIAGNOSIS — S5002XA Contusion of left elbow, initial encounter: Secondary | ICD-10-CM | POA: Insufficient documentation

## 2017-02-13 DIAGNOSIS — S4992XA Unspecified injury of left shoulder and upper arm, initial encounter: Secondary | ICD-10-CM | POA: Diagnosis present

## 2017-02-13 MED ORDER — NAPROXEN 500 MG PO TABS: 500.0000 mg | ORAL_TABLET | Freq: Two times a day (BID) | ORAL | 0 refills | Status: DC

## 2017-02-13 MED ORDER — METHOCARBAMOL 500 MG PO TABS: 500.0000 mg | ORAL_TABLET | Freq: Every evening | ORAL | 0 refills | Status: DC | PRN

## 2017-02-13 MED ORDER — IBUPROFEN 800 MG PO TABS
800.0000 mg | ORAL_TABLET | Freq: Once | ORAL | Status: AC
  Administered 2017-02-13: 800 mg via ORAL
  Filled 2017-02-13: qty 1

## 2017-02-13 NOTE — ED Notes (Signed)
Patient transported to X-ray 
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
## 2017-02-13 NOTE — ED Notes (Signed)
Pt verbalized understanding of d/c instructions and has no further questions. Pt is stable, A&Ox4, VSS.  
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
## 2017-10-14 IMAGING — DX DG SHOULDER 2+V*L*
4 series · 4 of 4 positions shown · non-contrast
Comparison: None.

CLINICAL DATA: Left shoulder pain

EXAM:
LEFT SHOULDER - 2+ VIEW

[w shoulder internal left]
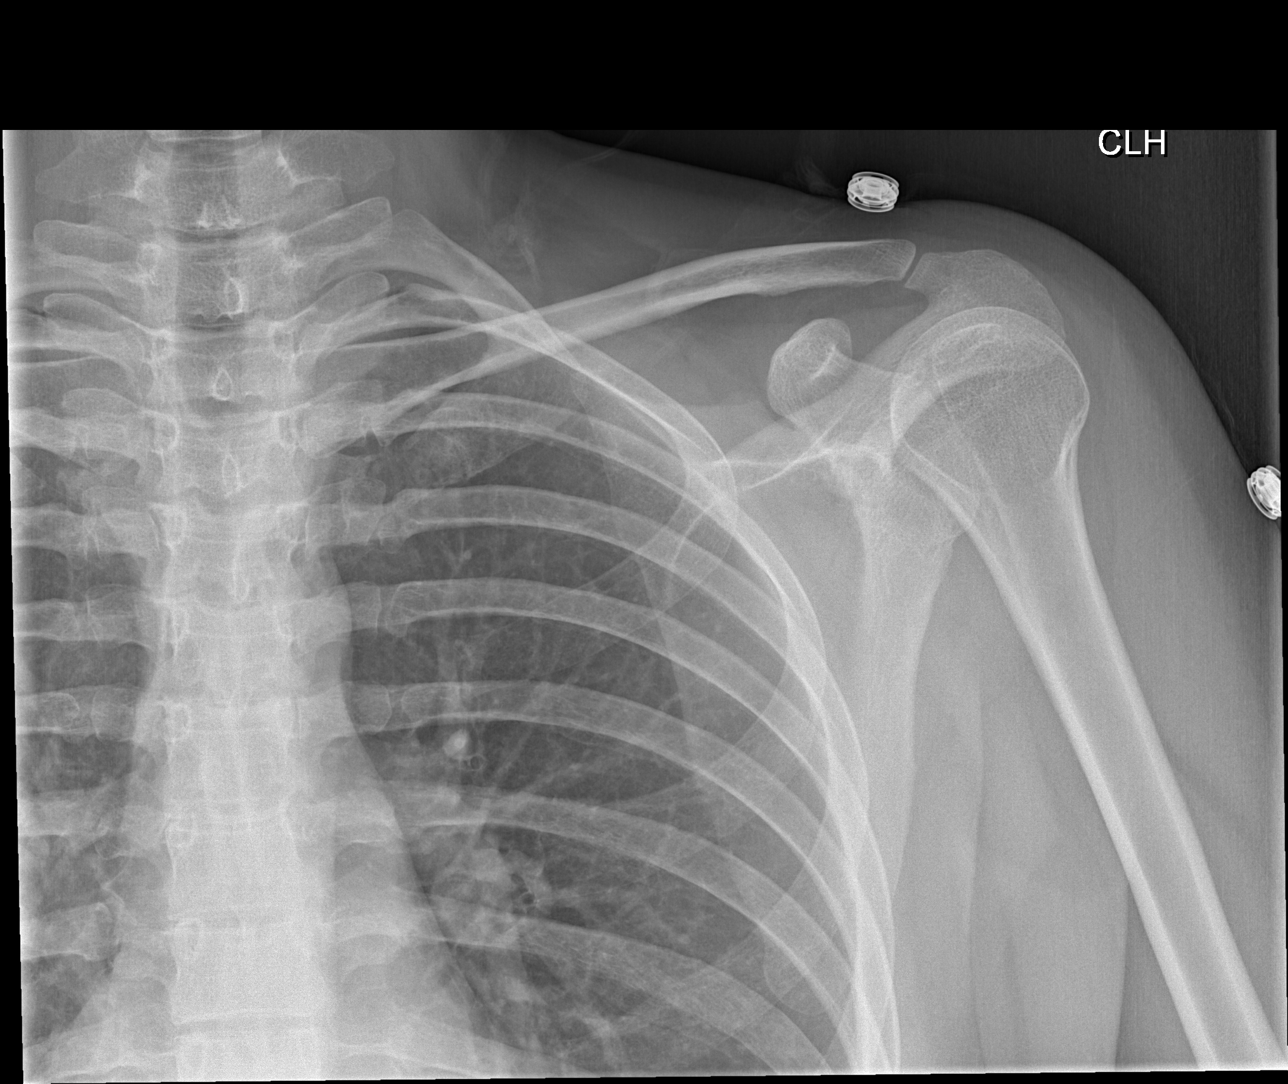

[w shoulder external left]
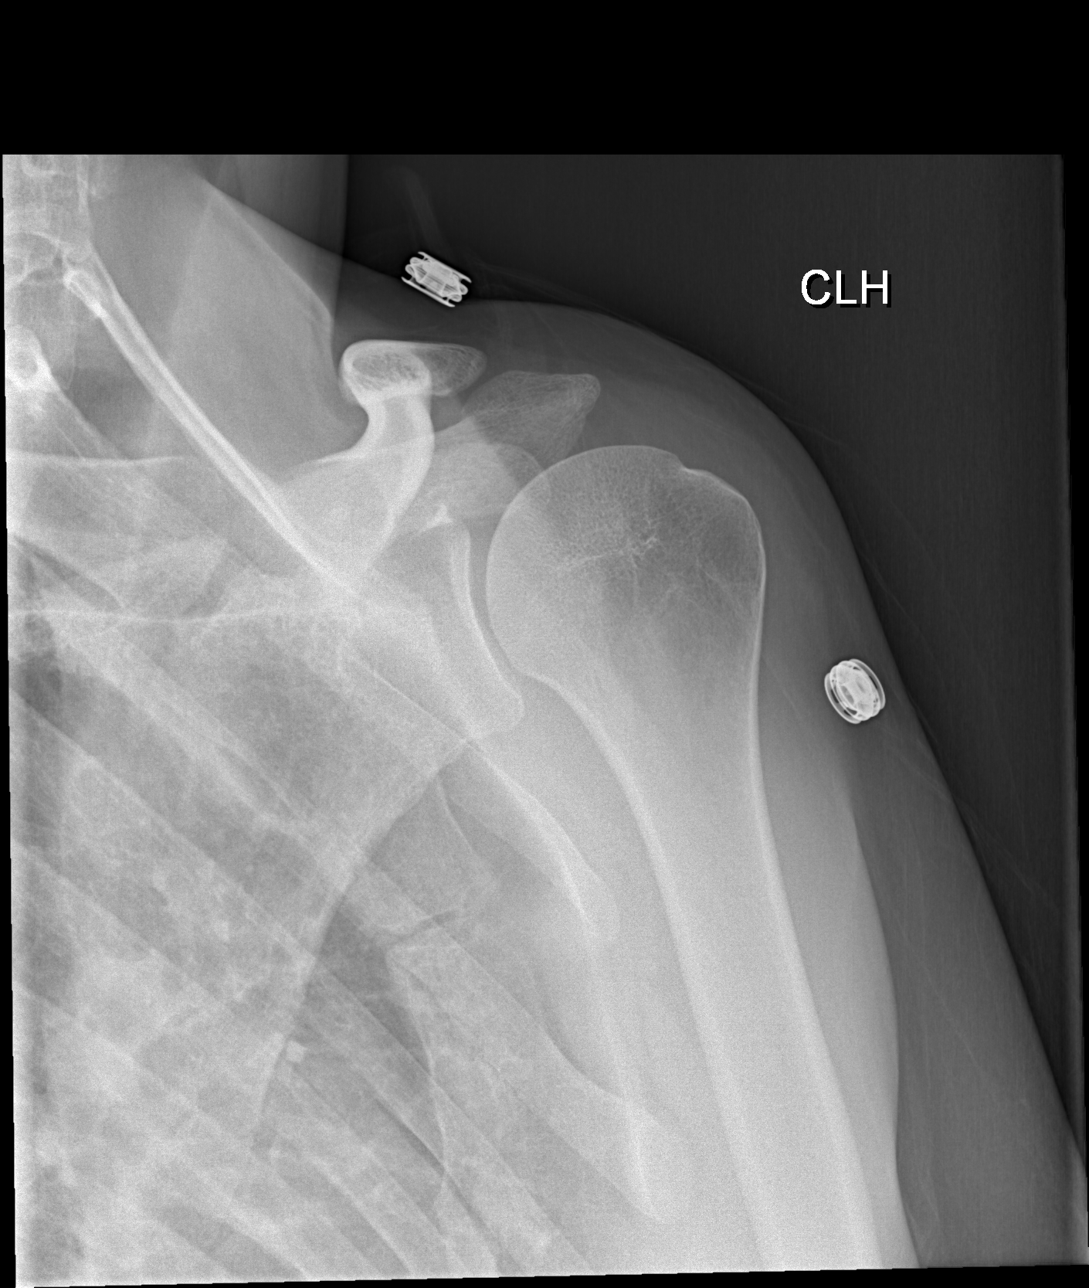

[w shoulder y-view left (1 of 2)]
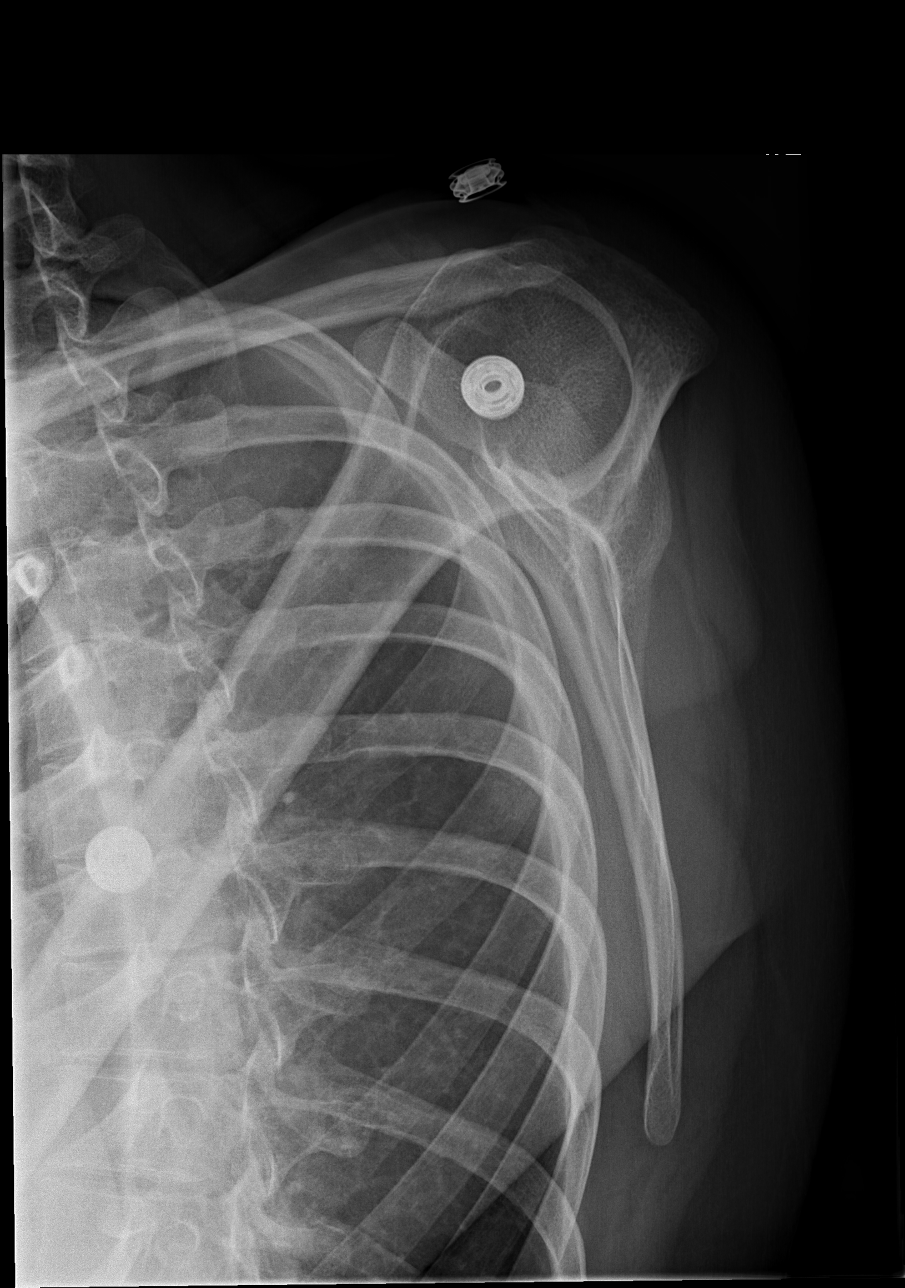

[w shoulder y-view left (2 of 2)]
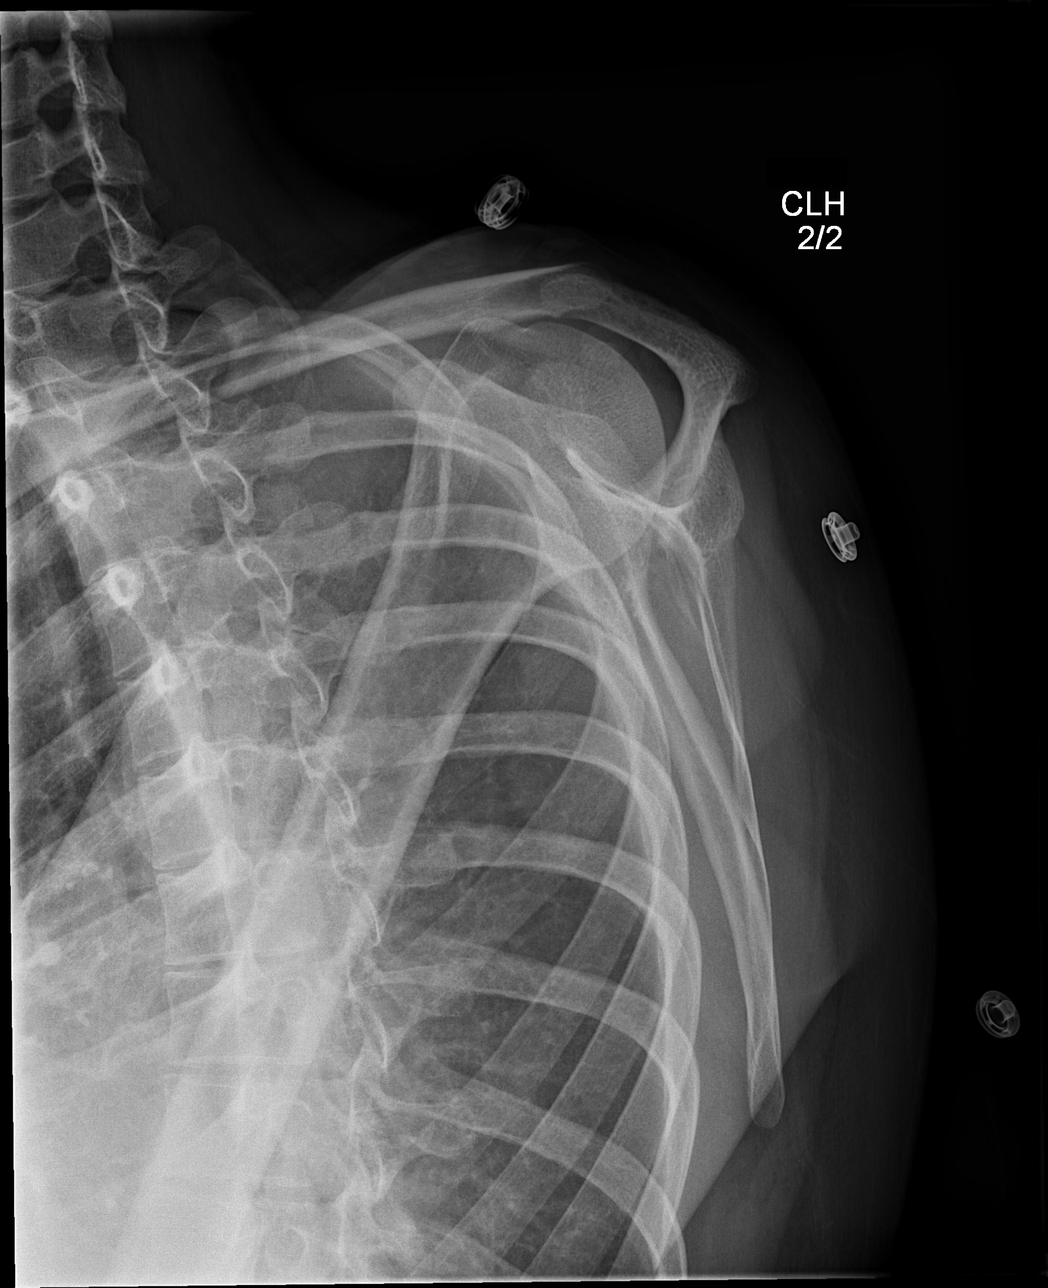

[4 of 4 positions shown; findings below may reference images not displayed]

FINDINGS: There is no evidence of fracture or dislocation. There is no
evidence of arthropathy or other focal bone abnormality. Soft
tissues are unremarkable.
IMPRESSION: No acute osseous injury of the left shoulder.

## 2021-06-16 ENCOUNTER — Other Ambulatory Visit: Payer: Self-pay

## 2021-06-16 ENCOUNTER — Emergency Department (INDEPENDENT_AMBULATORY_CARE_PROVIDER_SITE_OTHER)
Admission: RE | Admit: 2021-06-16 | Discharge: 2021-06-16 | Disposition: A | Discharge status: Home / Self Care | Payer: BC Managed Care – PPO | Source: Ambulatory Visit | Attending: Family Medicine | Admitting: Family Medicine

## 2021-06-16 VITALS — BP 106/74 | HR 74 | Temp 98.2°F | Resp 16

## 2021-06-16 DIAGNOSIS — J22 Unspecified acute lower respiratory infection: Secondary | ICD-10-CM | POA: Diagnosis not present

## 2021-06-16 MED ORDER — AMOXICILLIN-POT CLAVULANATE 875-125 MG PO TABS: 1.0000 | ORAL_TABLET | Freq: Two times a day (BID) | ORAL | 0 refills | Status: AC

## 2021-06-16 NOTE — Discharge Instructions (Addendum)
Take the Augmentin twice a day for a week I recommend a probiotic while you are on Augmentin After you have completed the Augmentin you can start your doxycycline Drink lots of water Continue using Flonase daily

## 2021-06-16 NOTE — ED Provider Notes (Signed)
[facility]    CSN: [id number] Arrival date & time: 06/16/21  1254      History   Chief Complaint Chief Complaint  Patient presents with   Appointment    1:00P   Nasal Congestion    HPI Melissa Potts is a 35 y.o. female.   HPI Patient states she has had cough cold and runny nose symptoms for 2 weeks.  She states much worse over the last 3 days.  Sore throat and postnasal drip are increasing.  No fever or chills.  No headache or body ache.  After 1 week of symptoms she did do a COVID test which was negative. Past Medical History:  Diagnosis Date   Anxiety     Patient Active Problem List   Diagnosis Date Noted   Active labor at term 07/15/2015    History reviewed. No pertinent surgical history.  OB History     Gravida  3   Para  1   Term  1   Preterm      AB  1   Living  1      SAB  1   IAB      Ectopic      Multiple  0   Live Births  1            Home Medications    Prior to Admission medications   Medication Sig Start Date End Date Taking? Authorizing Provider  acetaminophen (TYLENOL) 500 MG tablet Take 1,000 mg by mouth every 6 (six) hours as needed for moderate pain or headache.    Yes [provider]  amoxicillin-clavulanate (AUGMENTIN) 875-125 MG tablet Take 1 tablet by mouth every 12 (twelve) hours. 06/16/21  Yes Eustace Moore, MD  clobetasol cream (TEMOVATE) 0.05 % clobetasol 0.05 % topical cream  APPLY 1 APPLICATION EXTERNALLY TWICE A DAY FOR 30 DAYS   Yes [provider]  dicyclomine (BENTYL) 10 MG capsule Take 10 mg by mouth 4 (four) times daily. 05/14/21  Yes [provider]  doxycycline (VIBRAMYCIN) 100 MG capsule doxycycline hyclate 100 mg capsule  TAKE 1 CAPSULE BY MOUTH EVERY DAY FOR 30 DAYS   Yes [provider]  escitalopram (LEXAPRO) 10 MG tablet escitalopram 10 mg tablet  TAKE ONE TABLET BY MOUTH DAILY. 01/21/19  Yes [provider]  ibuprofen (ADVIL,MOTRIN)  200 MG tablet Take 400 mg by mouth every 6 (six) hours as needed for headache.   Yes [provider]  SUMAtriptan (IMITREX) 50 MG tablet sumatriptan 50 mg tablet  TAKE 1 TABLET ONSET OF HEADACHE MAY REPEAT IN 2 HOURS IF NEEDED MAX 2 PER DAY 12/28/20  Yes [provider]  fluticasone (FLONASE) 50 MCG/ACT nasal spray Place 2 sprays into both nostrils as needed.    [provider]  loratadine (CLARITIN) 10 MG tablet Take 10 mg by mouth 4 (four) times a week.    [provider]    Family History Family History  Problem Relation Age of Onset   Anxiety disorder Mother    Eczema Mother     Social History Social History   Tobacco Use   Smoking status: Never   Smokeless tobacco: Never  Vaping Use   Vaping Use: Never used  Substance Use Topics   Alcohol use: No   Drug use: No     Allergies   Banana   Review of Systems Review of Systems See HPI  Physical Exam Triage Vital Signs ED Triage Vitals  Enc Vitals Group     BP 06/16/21 1340 106/74     Pulse Rate 06/16/21 1340 74     Resp 06/16/21 1340 16     Temp 06/16/21 1340 98.2 F (36.8 C)     Temp Source 06/16/21 1340 Oral     SpO2 06/16/21 1340 97 %     Weight --      Height --      Head Circumference --      Peak Flow --      Pain Score 06/16/21 1335 1     Pain Loc --      Pain Edu? --      Excl. in GC? --    No data found.  Updated Vital Signs BP 106/74 (BP Location: Left Arm)   Pulse 74   Temp 98.2 F (36.8 C) (Oral)   Resp 16   SpO2 97%       Physical Exam Constitutional:      General: She is not in acute distress.    Appearance: She is well-developed. She is ill-appearing.  HENT:     Head: Normocephalic and atraumatic.     Right Ear: Tympanic membrane, ear canal and external ear normal.     Left Ear: Tympanic membrane, ear canal and external ear normal.     Nose: Congestion and rhinorrhea present.     Comments: Nasal membranes swollen and red.  Posterior pharynx  has lymphoid hyperplasia.  Yellow drainage    Mouth/Throat:     Mouth: Mucous membranes are moist.     Pharynx: Posterior oropharyngeal erythema present.  Eyes:     Conjunctiva/sclera: Conjunctivae normal.     Pupils: Pupils are equal, round, and reactive to light.  Cardiovascular:     Rate and Rhythm: Normal rate and regular rhythm.     Heart sounds: Normal heart sounds.  Pulmonary:     Effort: Pulmonary effort is normal. No respiratory distress.     Breath sounds: Normal breath sounds.  Abdominal:     General: There is no distension.     Palpations: Abdomen is soft.  Musculoskeletal:        General: Normal range of motion.     Cervical back: Normal range of motion.  Lymphadenopathy:     Cervical: No cervical adenopathy.  Skin:    General: Skin is warm and dry.  Neurological:     Mental Status: She is alert.     UC Treatments / Results  Labs (all labs ordered are listed, but only abnormal results are displayed) Labs Reviewed - No data to display  EKG   Radiology No results found.  Procedures Procedures (including critical care time)  Medications Ordered in UC Medications - No data to display  Initial Impression / Assessment and Plan / UC Course  I have reviewed the triage vital signs and the nursing notes.  Pertinent labs & imaging results that were available during my care of the patient were reviewed by me and considered in my medical decision making (see chart for details).     Patient states she has been fighting viral infection symptoms for more than 2 weeks.  Worsening of symptoms.  We will cover her with antibiotics and see if this helps. Final Clinical Impressions(s) / UC Diagnoses   Final diagnoses:  LRTI (lower respiratory tract infection)     Discharge Instructions      Take the Augmentin twice a day for a week I recommend a probiotic while  you are on Augmentin After you have completed the Augmentin you can start your doxycycline Drink  lots of water Continue using Flonase daily    ED Prescriptions     Medication Sig Dispense Auth. Provider   amoxicillin-clavulanate (AUGMENTIN) 875-125 MG tablet Take 1 tablet by mouth every 12 (twelve) hours. 14 tablet Eustace Moore, MD      PDMP not reviewed this encounter.   Eustace Moore, MD 06/16/21 [phone]

## 2021-06-16 NOTE — ED Triage Notes (Signed)
Patient presents to Urgent Care with complaints of sore throat, postnasal drainage, cough since 2 weeks ago. Patient reports on Monday symptoms worsened. Having sinus pressure, postnasal drainage, cough-dry, fatigued, neck soreness. Denies any fever or chills. Taking Ibuprofen, Tylenol and Zyrtec.

## 2023-10-14 HISTORY — PX: HIP SURGERY: SHX245

## 2024-09-21 ENCOUNTER — Emergency Department (HOSPITAL_COMMUNITY)

## 2024-09-21 ENCOUNTER — Encounter (HOSPITAL_COMMUNITY): Payer: Self-pay

## 2024-09-21 ENCOUNTER — Other Ambulatory Visit: Payer: Self-pay

## 2024-09-21 ENCOUNTER — Emergency Department (HOSPITAL_COMMUNITY)
Admission: EM | Admit: 2024-09-21 | Discharge: 2024-09-21 | Disposition: A | Discharge status: Home / Self Care | Attending: Emergency Medicine | Admitting: Emergency Medicine

## 2024-09-21 DIAGNOSIS — R000 Tachycardia, unspecified: Secondary | ICD-10-CM | POA: Diagnosis not present

## 2024-09-21 DIAGNOSIS — R200 Anesthesia of skin: Secondary | ICD-10-CM | POA: Diagnosis present

## 2024-09-21 DIAGNOSIS — G510 Bell's palsy: Secondary | ICD-10-CM | POA: Insufficient documentation

## 2024-09-21 HISTORY — DX: Migraine, unspecified, not intractable, without status migrainosus: G43.909

## 2024-09-21 LAB — URINE DRUG SCREEN
Amphetamines: NEGATIVE
Barbiturates: NEGATIVE
Benzodiazepines: NEGATIVE
Cocaine: NEGATIVE
Fentanyl: NEGATIVE
Methadone Scn, Ur: NEGATIVE
Opiates: NEGATIVE
Tetrahydrocannabinol: NEGATIVE

## 2024-09-21 LAB — DIFFERENTIAL
Abs Immature Granulocytes: 0.01 K/uL (ref 0.00–0.07)
Basophils Absolute: 0 K/uL (ref 0.0–0.1)
Basophils Relative: 1 %
Eosinophils Absolute: 0.1 K/uL (ref 0.0–0.5)
Eosinophils Relative: 2 %
Immature Granulocytes: 0 %
Lymphocytes Relative: 36 %
Lymphs Abs: 1.8 K/uL (ref 0.7–4.0)
Monocytes Absolute: 0.5 K/uL (ref 0.1–1.0)
Monocytes Relative: 9 %
Neutro Abs: 2.6 K/uL (ref 1.7–7.7)
Neutrophils Relative %: 52 %

## 2024-09-21 LAB — COMPREHENSIVE METABOLIC PANEL WITH GFR
ALT: 19 U/L (ref 0–44)
AST: 22 U/L (ref 15–41)
Albumin: 5 g/dL (ref 3.5–5.0)
Alkaline Phosphatase: 86 U/L (ref 38–126)
Anion gap: 18 — ABNORMAL HIGH (ref 5–15)
BUN: 12 mg/dL (ref 6–20)
CO2: 19 mmol/L — ABNORMAL LOW (ref 22–32)
Calcium: 9.7 mg/dL (ref 8.9–10.3)
Chloride: 103 mmol/L (ref 98–111)
Creatinine, Ser: 0.77 mg/dL (ref 0.44–1.00)
GFR, Estimated: >60 mL/min
Glucose, Bld: 76 mg/dL (ref 70–99)
Potassium: 3.8 mmol/L (ref 3.5–5.1)
Sodium: 139 mmol/L (ref 135–145)
Total Bilirubin: 0.9 mg/dL (ref 0.0–1.2)
Total Protein: 8 g/dL (ref 6.5–8.1)

## 2024-09-21 LAB — CBC
HCT: 47.6 % — ABNORMAL HIGH (ref 36.0–46.0)
Hemoglobin: 15.6 g/dL — ABNORMAL HIGH (ref 12.0–15.0)
MCH: 28.4 pg (ref 26.0–34.0)
MCHC: 32.8 g/dL (ref 30.0–36.0)
MCV: 86.5 fL (ref 80.0–100.0)
Platelets: 273 K/uL (ref 150–400)
RBC: 5.5 MIL/uL — ABNORMAL HIGH (ref 3.87–5.11)
RDW: 12.5 % (ref 11.5–15.5)
WBC: 5 K/uL (ref 4.0–10.5)
nRBC: 0 % (ref 0.0–0.2)

## 2024-09-21 LAB — PROTIME-INR
INR: 1 (ref 0.8–1.2)
Prothrombin Time: 13.4 s (ref 11.4–15.2)

## 2024-09-21 LAB — CBG MONITORING, ED: Glucose-Capillary: 101 mg/dL — ABNORMAL HIGH (ref 70–99)

## 2024-09-21 LAB — POC URINE PREG, ED: Preg Test, Ur: NEGATIVE

## 2024-09-21 LAB — APTT: aPTT: 29 s (ref 24–36)

## 2024-09-21 LAB — ETHANOL: Alcohol, Ethyl (B): <15 mg/dL

## 2024-09-21 MED ORDER — PREDNISONE 50 MG PO TABS
60.0000 mg | ORAL_TABLET | Freq: Once | ORAL | Status: AC
  Administered 2024-09-21: 60 mg via ORAL
  Filled 2024-09-21: qty 1

## 2024-09-21 MED ORDER — LORAZEPAM 2 MG/ML IJ SOLN
1.0000 mg | Freq: Once | INTRAMUSCULAR | Status: AC
  Administered 2024-09-21: 1 mg via INTRAVENOUS
  Filled 2024-09-21: qty 1

## 2024-09-21 MED ORDER — PREDNISONE 50 MG PO TABS: 50.0000 mg | ORAL_TABLET | Freq: Every day | ORAL | 0 refills | Status: AC

## 2024-09-21 MED ORDER — VALACYCLOVIR HCL 1 G PO TABS: 1000.0000 mg | ORAL_TABLET | Freq: Three times a day (TID) | ORAL | 0 refills | Status: AC

## 2024-09-21 MED ORDER — SODIUM CHLORIDE 0.9 % IV BOLUS
1000.0000 mL | Freq: Once | INTRAVENOUS | Status: AC
  Administered 2024-09-21: 1000 mL via INTRAVENOUS

## 2024-09-21 NOTE — ED Notes (Signed)
 Patient transported to CT

## 2024-09-21 NOTE — ED Provider Notes (Signed)
 " [facility] EMERGENCY DEPARTMENT AT [facility] Provider Note   CSN: [id number] Arrival date & time: 09/21/24  1008     Patient presents with: Numbness   Melissa Potts is a 39 y.o. female.   Pt is a 39 yo female with pmhx significant for anxiety and migraines.  She developed right facial numbness and body numbness.  She was initially unable to speak, but that has resolved.  Pt appears very anxious on exam.       Prior to Admission medications  Medication Sig Start Date End Date Taking? Authorizing Provider  lisdexamfetamine (VYVANSE) 20 MG capsule Take 20 mg by mouth daily.   Yes [provider]  predniSONE  (DELTASONE ) 50 MG tablet Take 1 tablet (50 mg total) by mouth daily. 09/21/24  Yes Dean Clarity, MD  Ubrogepant (UBRELVY) 100 MG TABS Take 100 mg by mouth as needed.   Yes [provider]  valACYclovir  (VALTREX ) 1000 MG tablet Take 1 tablet (1,000 mg total) by mouth 3 (three) times daily. 09/21/24  Yes Sherwin Hollingshed, MD  venlafaxine (EFFEXOR) 37.5 MG tablet Take 37.5 mg by mouth 2 (two) times daily.   Yes [provider]  acetaminophen  (TYLENOL ) 500 MG tablet Take 1,000 mg by mouth every 6 (six) hours as needed for moderate pain or headache.     [provider]  amoxicillin -clavulanate (AUGMENTIN ) 875-125 MG tablet Take 1 tablet by mouth every 12 (twelve) hours. 06/16/21   Maranda Jamee Jacob, MD  clobetasol cream (TEMOVATE) 0.05 % clobetasol 0.05 % topical cream  APPLY 1 APPLICATION EXTERNALLY TWICE A DAY FOR 30 DAYS    [provider]  dicyclomine (BENTYL) 10 MG capsule Take 10 mg by mouth 4 (four) times daily. 05/14/21   [provider]  doxycycline (VIBRAMYCIN) 100 MG capsule doxycycline hyclate 100 mg capsule  TAKE 1 CAPSULE BY MOUTH EVERY DAY FOR 30 DAYS    [provider]  escitalopram (LEXAPRO) 10 MG tablet escitalopram 10 mg tablet  TAKE ONE TABLET BY MOUTH DAILY. 01/21/19   [provider]   fluticasone (FLONASE) 50 MCG/ACT nasal spray Place 2 sprays into both nostrils as needed.    [provider]  ibuprofen  (ADVIL ,MOTRIN ) 200 MG tablet Take 400 mg by mouth every 6 (six) hours as needed for headache.    [provider]  loratadine (CLARITIN) 10 MG tablet Take 10 mg by mouth 4 (four) times a week.    [provider]  SUMAtriptan (IMITREX) 50 MG tablet sumatriptan 50 mg tablet  TAKE 1 TABLET ONSET OF HEADACHE MAY REPEAT IN 2 HOURS IF NEEDED MAX 2 PER DAY 12/28/20   [provider]    Allergies: Banana    Review of Systems  Neurological:  Positive for numbness.  Psychiatric/Behavioral:  The patient is nervous/anxious.   All other systems reviewed and are negative.   Updated Vital Signs BP 116/66   Pulse 88   Temp 98.2 F (36.8 C) (Oral)   Resp 15   Ht 5' 10.75 (1.797 m)   Wt 70.5 kg   LMP 09/04/2024 (Approximate)   SpO2 100%   BMI 21.83 kg/m   Physical Exam Vitals and nursing note reviewed.  Constitutional:      Appearance: Normal appearance.  HENT:     Head: Normocephalic and atraumatic.     Right Ear: External ear normal.     Left Ear: External ear normal.     Nose: Nose normal.     Mouth/Throat:  Mouth: Mucous membranes are moist.     Pharynx: Oropharynx is clear.  Eyes:     Extraocular Movements: Extraocular movements intact.     Conjunctiva/sclera: Conjunctivae normal.     Pupils: Pupils are equal, round, and reactive to light.  Cardiovascular:     Rate and Rhythm: Regular rhythm. Tachycardia present.     Pulses: Normal pulses.     Heart sounds: Normal heart sounds.  Pulmonary:     Effort: Tachypnea present.     Breath sounds: Normal breath sounds.     Comments: Triage nurse vitals report RR of 7.  This is incorrect.  Pt is breathing at least 25 bpm. Musculoskeletal:        General: Normal range of motion.     Cervical back: Normal range of motion and neck supple.  Skin:    General: Skin is warm.      Capillary Refill: Capillary refill takes less than 2 seconds.  Neurological:     Mental Status: She is alert and oriented to person, place, and time.     Comments: Mild right lower facial droop.  ? Poor effort.  She is able to blow out cheeks.  Diffuse shaking.  Psychiatric:        Mood and Affect: Mood is anxious. Affect is tearful.     (all labs ordered are listed, but only abnormal results are displayed) Labs Reviewed  CBC - Abnormal; Notable for the following components:      Result Value   RBC 5.50 (*)    Hemoglobin 15.6 (*)    HCT 47.6 (*)    All other components within normal limits  COMPREHENSIVE METABOLIC PANEL WITH GFR - Abnormal; Notable for the following components:   CO2 19 (*)    Anion gap 18 (*)    All other components within normal limits  CBG MONITORING, ED - Abnormal; Notable for the following components:   Glucose-Capillary 101 (*)    All other components within normal limits  DIFFERENTIAL  URINE DRUG SCREEN  PROTIME-INR  APTT  ETHANOL  POC URINE PREG, ED    EKG: EKG Interpretation Date/Time:  Saturday September 21 2024 10:19:26 EST Ventricular Rate:  109 PR Interval:  130 QRS Duration:  87 QT Interval:  332 QTC Calculation: 447 R Axis:   88  Text Interpretation: Sinus tachycardia Right atrial enlargement Minimal ST depression, diffuse leads No old tracing to compare Confirmed by Dean Clarity [phone]) on 09/21/2024 10:29:32 AM  Radiology: MR BRAIN WO CONTRAST Result Date: 09/21/2024 EXAM: MRI BRAIN WITHOUT CONTRAST 09/21/2024 02:58:44 PM TECHNIQUE: Multiplanar multisequence MRI of the head/brain was performed without the administration of intravenous contrast. COMPARISON: Head CT 09/21/2024. CLINICAL HISTORY: Neuro deficit, acute, stroke suspected. Right facial numbness and right sided body numbness. FINDINGS: BRAIN AND VENTRICLES: There is no evidence of an acute infarct, intracranial hemorrhage, mass, midline shift, hydrocephalus, or extra-axial  fluid collection. Cerebral volume is normal. The brain is normal in signal. Major intracranial vascular flow voids are preserved. ORBITS: No significant abnormality. SINUSES AND MASTOIDS: No significant abnormality. BONES AND SOFT TISSUES: Normal marrow signal. No significant soft tissue abnormality. IMPRESSION: 1. Negative brain MRI. Electronically signed by: Dasie Hamburg MD MD 09/21/2024 03:32 PM EST RP Workstation: [id number]   CT HEAD WO CONTRAST Result Date: 09/21/2024 EXAM: CT HEAD WITHOUT CONTRAST 09/21/2024 10:37:57 AM TECHNIQUE: CT of the head was performed without the administration of intravenous contrast. Automated exposure control, iterative reconstruction, and/or weight based adjustment of the mA/kV was  utilized to reduce the radiation dose to as low as reasonably achievable. COMPARISON: Head CT 09/05/2016. CLINICAL HISTORY: 39 year old female. Acute neuro deficit, stroke suspected. Right facial numbness. FINDINGS: BRAIN AND VENTRICLES: No acute hemorrhage. No evidence of acute infarct. No hydrocephalus. No extra-axial collection. No mass effect or midline shift. Normal brain volume. Normal gray-white differentiation. No suspicious intracranial vascular hyperdensity. ORBITS: No acute abnormality. SINUSES: Paranasal sinuses, tympanic cavities and mastoids are clear. SOFT TISSUES AND SKULL: No acute soft tissue abnormality. No skull fracture. IMPRESSION: 1. Normal non-contrast head CT. Electronically signed by: Helayne Hurst MD MD 09/21/2024 10:48 AM EST RP Workstation: [id number]     Procedures   Medications Ordered in the ED  predniSONE  (DELTASONE ) tablet 60 mg (has no administration in time range)  LORazepam  (ATIVAN ) injection 1 mg (1 mg Intravenous Given 09/21/24 1027)  sodium chloride  0.9 % bolus 1,000 mL (1,000 mLs Intravenous New Bag/Given 09/21/24 1152)                                    Medical Decision Making Amount and/or Complexity of Data Reviewed Labs: ordered. Radiology:  ordered.  Risk Prescription drug management.   This patient presents to the ED for concern of numbness, this involves an extensive number of treatment options, and is a complaint that carries with it a high risk of complications and morbidity.  The differential diagnosis includes cva, tia, anxiety, electrolyte abn, migraine, bells palsy   Co morbidities that complicate the patient evaluation  Anxiety, migraines   Additional history obtained:  Additional history obtained from epic chart review External records from outside source obtained and reviewed including EMS report   Lab Tests:  I Ordered, and personally interpreted labs.  The pertinent results include:  cbc nl, cmp nl other than CO2 low at 19; preg neg; ua neg; etoh neg; uds neg   Imaging Studies ordered:  I ordered imaging studies including ct head and mri brain I independently visualized and interpreted imaging which showed  CT head:  Normal non-contrast head CT.  MRI brain: Negative brain MRI.  I agree with the radiologist interpretation   Cardiac Monitoring:  The patient was maintained on a cardiac monitor.  I personally viewed and interpreted the cardiac monitored which showed an underlying rhythm of: st   Medicines ordered and prescription drug management:  I ordered medication including ivfs/ativan   for sx  Reevaluation of the patient after these medicines showed that the patient improved I have reviewed the patients home medicines and have made adjustments as needed   Test Considered:  mri   Critical Interventions:  ct  Problem List / ED Course:  Bells palsy:  mri neg for cva.  Pt is stable for d/c.  Return if worse. She is d/c with prednisone  and valtrex .     Reevaluation:  After the interventions noted above, I reevaluated the patient and found that they have :improved   Social Determinants of Health:  Lives at home   Dispostion:  After consideration of the diagnostic results  and the patients response to treatment, I feel that the patent would benefit from discharge with outpatient f/u.       Final diagnoses:  Bell's palsy    ED Discharge Orders          Ordered    predniSONE  (DELTASONE ) 50 MG tablet  Daily        09/21/24 1544  valACYclovir  (VALTREX ) 1000 MG tablet  3 times daily        09/21/24 1544               Dean Clarity, MD 09/21/24 1545  "

## 2024-09-21 NOTE — ED Notes (Signed)
 CBG 101

## 2024-09-21 NOTE — ED Notes (Signed)
 Pt/family received d/c paperwork at this time. After going over the paperwork any questions, comments, or concerns were answered to the best of this nurse's knowledge. The pt/family verbally acknowledged the teachings/instructions.

## 2024-09-21 NOTE — ED Triage Notes (Signed)
 Patient arrives via [facility] c/c R facial numbness. States she was sitting in a coffee shop when she felt a cool breeze on her face and then noted numbness. She was initially unable to speak. Also endorsed initial R sided body numbness that has improved but still does not feel right.
